# Patient Record
Sex: Female | Born: 1975 | Race: White | Hispanic: No | Marital: Married
Health system: Southern US, Community
[De-identification: ages and names within clinical notes are randomized; demographics above are authoritative.]

---

## 1999-05-07 ENCOUNTER — Ambulatory Visit (HOSPITAL_BASED_OUTPATIENT_CLINIC_OR_DEPARTMENT_OTHER): Admission: RE | Admit: 1999-05-07 | Discharge: 1999-05-07 | Payer: Self-pay | Admitting: General Surgery

## 1999-05-10 ENCOUNTER — Other Ambulatory Visit: Admission: RE | Admit: 1999-05-10 | Discharge: 1999-05-10 | Payer: Self-pay | Admitting: General Surgery

## 2004-07-15 ENCOUNTER — Other Ambulatory Visit: Admission: RE | Admit: 2004-07-15 | Discharge: 2004-07-15 | Payer: Self-pay | Admitting: Family Medicine

## 2004-08-04 ENCOUNTER — Other Ambulatory Visit: Admission: RE | Admit: 2004-08-04 | Discharge: 2004-08-04 | Payer: Self-pay | Admitting: Obstetrics and Gynecology

## 2005-04-20 ENCOUNTER — Other Ambulatory Visit: Admission: RE | Admit: 2005-04-20 | Discharge: 2005-04-20 | Payer: Self-pay | Admitting: Obstetrics and Gynecology

## 2005-11-25 ENCOUNTER — Other Ambulatory Visit: Admission: RE | Admit: 2005-11-25 | Discharge: 2005-11-25 | Payer: Self-pay | Admitting: Obstetrics and Gynecology

## 2008-01-19 ENCOUNTER — Ambulatory Visit (HOSPITAL_COMMUNITY): Admission: RE | Admit: 2008-01-19 | Discharge: 2008-01-19 | Payer: Self-pay | Admitting: Obstetrics and Gynecology

## 2008-01-19 ENCOUNTER — Encounter (INDEPENDENT_AMBULATORY_CARE_PROVIDER_SITE_OTHER): Payer: Self-pay | Admitting: Obstetrics and Gynecology

## 2010-07-23 ENCOUNTER — Encounter: Admission: RE | Admit: 2010-07-23 | Discharge: 2010-07-23 | Payer: Self-pay | Admitting: Obstetrics and Gynecology

## 2011-03-08 ENCOUNTER — Ambulatory Visit (HOSPITAL_BASED_OUTPATIENT_CLINIC_OR_DEPARTMENT_OTHER)
Admission: RE | Admit: 2011-03-08 | Discharge: 2011-03-08 | Disposition: A | Discharge status: Home / Self Care | Payer: Managed Care, Other (non HMO) | Source: Ambulatory Visit | Attending: Orthopaedic Surgery | Admitting: Orthopaedic Surgery

## 2011-03-08 DIAGNOSIS — M23329 Other meniscus derangements, posterior horn of medial meniscus, unspecified knee: Secondary | ICD-10-CM | POA: Insufficient documentation

## 2011-03-08 DIAGNOSIS — M2240 Chondromalacia patellae, unspecified knee: Secondary | ICD-10-CM | POA: Insufficient documentation

## 2011-03-15 NOTE — Op Note (Signed)
  NAMEPHILISHA, Carolyn Taylor NO.:  [id number]  MEDICAL RECORD NO.:  [id number]           PATIENT TYPE:  LOCATION:                                 FACILITY:  PHYSICIAN:  Lubertha Basque. Jerl Santos, M.D.     DATE OF BIRTH:  DATE OF PROCEDURE:  03/08/2011 DATE OF DISCHARGE:                              OPERATIVE REPORT   PREOPERATIVE DIAGNOSES: 1. Left knee torn medial meniscus. 2. Left knee chondromalacia patella.  POSTOPERATIVE DIAGNOSES: 1. Left knee torn medial meniscus. 2. Left knee chondromalacia patella.  PROCEDURE: 1. Left knee partial medial meniscectomy. 2. Left knee abrasion, chondroplasty patellofemoral.  ANESTHESIA:  General.  ATTENDING SURGEON:  Lubertha Basque. Jerl Santos, MD  ASSISTANT:  Lindwood Qua, PA   INDICATIONS FOR PROCEDURE:  The patient is a 35 year old woman with several months of left knee pain and swelling.  This has persisted despite oral anti-inflammatories and rest.  By MRI scan, she has a displaced medial meniscus tear.  She has pain, which limits ability to rest and walk, and she is offered an arthroscopy.  Informed operative consent was obtained after discussion of possible complications including reaction to anesthesia and infection.  SUMMARY OF FINDINGS AND PROCEDURE:  Under general anesthesia an arthroscopy, the left knee was performed.  Suprapatellar pouch was benign while the patellofemoral joint exhibited some focal breakdown at the apex of the patella.  A chondroplasty with brief abrasion to bleeding bone was performed.  Medial compartment was notable for a degenerative displaced tear of the posterior horn of the medial meniscus.  About a 15% partial medial meniscectomy was required.  There were no degenerative changes in this compartment.  The ACL was normal, and the lateral compartment completely benign.  DESCRIPTION OF PROCEDURE:  The patient was taken to the operating suite where general anesthetic was applied without  difficulty.  She was positioned supine and prepped and draped in normal sterile fashion. After the administration of IV Kefzol, antibiotic, a left knee arthroscopy was performed through a total of two portals.  Findings were as noted above and procedure consisted predominately the partial medial meniscectomy done with a basket and shaver.  We also performed the brief abrasion chondroplasty patellofemoral.  The knee was thoroughly irrigated followed by placement of Marcaine with epinephrine and morphine.  Adaptic was placed over the portals followed by dry gauze and loose Ace wrap.  Estimated blood loss and intraoperative fluids were obtained from anesthesia records.  DISPOSITION:  The patient was extubated in the operating room and taken to recovery room in stable condition.  She was to go home the same day and follow up in the office closely.  I will contact her by phone tonight.     Lubertha Basque Jerl Santos, M.D.     PGD/MEDQ  D:  03/08/2011  T:  03/08/2011  Job:  [id number]  Electronically Signed by Marcene Corning M.D. on 03/15/2011 02:24:35 PM
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
## 2011-08-08 LAB — CBC
HCT: 43.9
Hemoglobin: 15.6 — ABNORMAL HIGH
MCHC: 35.5
Platelets: 333
RDW: 12.5
WBC: 8.5

## 2014-06-23 ENCOUNTER — Other Ambulatory Visit: Payer: Self-pay | Admitting: Family Medicine

## 2014-06-23 ENCOUNTER — Ambulatory Visit
Admission: RE | Admit: 2014-06-23 | Discharge: 2014-06-23 | Disposition: A | Discharge status: Home / Self Care | Payer: 59 | Source: Ambulatory Visit | Attending: Family Medicine | Admitting: Family Medicine

## 2014-06-23 DIAGNOSIS — R52 Pain, unspecified: Secondary | ICD-10-CM
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
## 2018-07-06 DIAGNOSIS — L299 Pruritus, unspecified: Secondary | ICD-10-CM | POA: Diagnosis not present

## 2018-07-06 DIAGNOSIS — Z131 Encounter for screening for diabetes mellitus: Secondary | ICD-10-CM | POA: Diagnosis not present

## 2018-07-06 DIAGNOSIS — Z13220 Encounter for screening for lipoid disorders: Secondary | ICD-10-CM | POA: Diagnosis not present

## 2018-07-06 DIAGNOSIS — F322 Major depressive disorder, single episode, severe without psychotic features: Secondary | ICD-10-CM | POA: Diagnosis not present

## 2018-07-06 DIAGNOSIS — Z0000 Encounter for general adult medical examination without abnormal findings: Secondary | ICD-10-CM | POA: Diagnosis not present

## 2018-08-17 DIAGNOSIS — Z1231 Encounter for screening mammogram for malignant neoplasm of breast: Secondary | ICD-10-CM | POA: Diagnosis not present

## 2018-09-21 DIAGNOSIS — F411 Generalized anxiety disorder: Secondary | ICD-10-CM | POA: Diagnosis not present

## 2018-09-21 DIAGNOSIS — F331 Major depressive disorder, recurrent, moderate: Secondary | ICD-10-CM | POA: Diagnosis not present

## 2018-10-02 DIAGNOSIS — E7800 Pure hypercholesterolemia, unspecified: Secondary | ICD-10-CM | POA: Diagnosis not present

## 2018-11-22 DIAGNOSIS — F331 Major depressive disorder, recurrent, moderate: Secondary | ICD-10-CM | POA: Diagnosis not present

## 2018-11-22 DIAGNOSIS — F411 Generalized anxiety disorder: Secondary | ICD-10-CM | POA: Diagnosis not present

## 2019-04-17 DIAGNOSIS — Z6829 Body mass index (BMI) 29.0-29.9, adult: Secondary | ICD-10-CM | POA: Diagnosis not present

## 2019-04-17 DIAGNOSIS — Z1151 Encounter for screening for human papillomavirus (HPV): Secondary | ICD-10-CM | POA: Diagnosis not present

## 2019-04-17 DIAGNOSIS — Z01419 Encounter for gynecological examination (general) (routine) without abnormal findings: Secondary | ICD-10-CM | POA: Diagnosis not present

## 2019-05-20 DIAGNOSIS — F411 Generalized anxiety disorder: Secondary | ICD-10-CM | POA: Diagnosis not present

## 2019-05-20 DIAGNOSIS — F331 Major depressive disorder, recurrent, moderate: Secondary | ICD-10-CM | POA: Diagnosis not present

## 2019-06-10 DIAGNOSIS — N6011 Diffuse cystic mastopathy of right breast: Secondary | ICD-10-CM | POA: Diagnosis not present

## 2019-06-10 DIAGNOSIS — N6012 Diffuse cystic mastopathy of left breast: Secondary | ICD-10-CM | POA: Diagnosis not present

## 2019-09-12 DIAGNOSIS — N951 Menopausal and female climacteric states: Secondary | ICD-10-CM | POA: Diagnosis not present

## 2019-09-12 DIAGNOSIS — E559 Vitamin D deficiency, unspecified: Secondary | ICD-10-CM | POA: Diagnosis not present

## 2019-09-12 DIAGNOSIS — R5383 Other fatigue: Secondary | ICD-10-CM | POA: Diagnosis not present

## 2019-09-12 DIAGNOSIS — R635 Abnormal weight gain: Secondary | ICD-10-CM | POA: Diagnosis not present

## 2019-09-12 DIAGNOSIS — Z6827 Body mass index (BMI) 27.0-27.9, adult: Secondary | ICD-10-CM | POA: Diagnosis not present

## 2019-09-12 DIAGNOSIS — F3289 Other specified depressive episodes: Secondary | ICD-10-CM | POA: Diagnosis not present

## 2019-09-12 DIAGNOSIS — E782 Mixed hyperlipidemia: Secondary | ICD-10-CM | POA: Diagnosis not present

## 2019-09-16 DIAGNOSIS — R5383 Other fatigue: Secondary | ICD-10-CM | POA: Diagnosis not present

## 2019-09-16 DIAGNOSIS — N951 Menopausal and female climacteric states: Secondary | ICD-10-CM | POA: Diagnosis not present

## 2019-09-16 DIAGNOSIS — Z1331 Encounter for screening for depression: Secondary | ICD-10-CM | POA: Diagnosis not present

## 2019-09-16 DIAGNOSIS — Z1339 Encounter for screening examination for other mental health and behavioral disorders: Secondary | ICD-10-CM | POA: Diagnosis not present

## 2019-09-16 DIAGNOSIS — E782 Mixed hyperlipidemia: Secondary | ICD-10-CM | POA: Diagnosis not present

## 2019-09-16 DIAGNOSIS — Z6827 Body mass index (BMI) 27.0-27.9, adult: Secondary | ICD-10-CM | POA: Diagnosis not present

## 2019-09-25 DIAGNOSIS — Z6828 Body mass index (BMI) 28.0-28.9, adult: Secondary | ICD-10-CM | POA: Diagnosis not present

## 2019-09-25 DIAGNOSIS — E559 Vitamin D deficiency, unspecified: Secondary | ICD-10-CM | POA: Diagnosis not present

## 2019-10-03 DIAGNOSIS — Z6826 Body mass index (BMI) 26.0-26.9, adult: Secondary | ICD-10-CM | POA: Diagnosis not present

## 2019-10-03 DIAGNOSIS — E782 Mixed hyperlipidemia: Secondary | ICD-10-CM | POA: Diagnosis not present

## 2019-10-04 DIAGNOSIS — F331 Major depressive disorder, recurrent, moderate: Secondary | ICD-10-CM | POA: Diagnosis not present

## 2019-10-04 DIAGNOSIS — F411 Generalized anxiety disorder: Secondary | ICD-10-CM | POA: Diagnosis not present

## 2019-10-14 DIAGNOSIS — E782 Mixed hyperlipidemia: Secondary | ICD-10-CM | POA: Diagnosis not present

## 2019-10-14 DIAGNOSIS — Z6826 Body mass index (BMI) 26.0-26.9, adult: Secondary | ICD-10-CM | POA: Diagnosis not present

## 2019-10-14 DIAGNOSIS — E559 Vitamin D deficiency, unspecified: Secondary | ICD-10-CM | POA: Diagnosis not present

## 2019-10-17 DIAGNOSIS — F411 Generalized anxiety disorder: Secondary | ICD-10-CM | POA: Diagnosis not present

## 2019-10-17 DIAGNOSIS — F331 Major depressive disorder, recurrent, moderate: Secondary | ICD-10-CM | POA: Diagnosis not present

## 2019-10-17 DIAGNOSIS — G4700 Insomnia, unspecified: Secondary | ICD-10-CM | POA: Diagnosis not present

## 2019-10-22 DIAGNOSIS — Z6825 Body mass index (BMI) 25.0-25.9, adult: Secondary | ICD-10-CM | POA: Diagnosis not present

## 2019-10-22 DIAGNOSIS — E559 Vitamin D deficiency, unspecified: Secondary | ICD-10-CM | POA: Diagnosis not present

## 2019-11-12 DIAGNOSIS — G4700 Insomnia, unspecified: Secondary | ICD-10-CM | POA: Diagnosis not present

## 2019-11-12 DIAGNOSIS — F331 Major depressive disorder, recurrent, moderate: Secondary | ICD-10-CM | POA: Diagnosis not present

## 2019-11-12 DIAGNOSIS — F411 Generalized anxiety disorder: Secondary | ICD-10-CM | POA: Diagnosis not present
